# Patient Record
Sex: Female | Born: 1955 | Hispanic: Yes | Marital: Single | Smoking: Never smoker
Health system: Southern US, Community
[De-identification: ages and names within clinical notes are randomized; demographics above are authoritative.]

## PROBLEM LIST (undated history)

## (undated) HISTORY — PX: LEG SURGERY: SHX1003

---

## 2016-09-04 ENCOUNTER — Emergency Department
Admission: EM | Admit: 2016-09-04 | Discharge: 2016-09-04 | Disposition: A | Discharge status: Home / Self Care | Payer: No Typology Code available for payment source | Attending: Emergency Medicine | Admitting: Emergency Medicine

## 2016-09-04 ENCOUNTER — Emergency Department: Payer: No Typology Code available for payment source

## 2016-09-04 ENCOUNTER — Encounter: Payer: Self-pay | Admitting: Emergency Medicine

## 2016-09-04 DIAGNOSIS — M542 Cervicalgia: Secondary | ICD-10-CM | POA: Insufficient documentation

## 2016-09-04 DIAGNOSIS — Y92410 Unspecified street and highway as the place of occurrence of the external cause: Secondary | ICD-10-CM | POA: Insufficient documentation

## 2016-09-04 DIAGNOSIS — M25572 Pain in left ankle and joints of left foot: Secondary | ICD-10-CM | POA: Diagnosis not present

## 2016-09-04 DIAGNOSIS — Y999 Unspecified external cause status: Secondary | ICD-10-CM | POA: Diagnosis not present

## 2016-09-04 DIAGNOSIS — Y939 Activity, unspecified: Secondary | ICD-10-CM | POA: Diagnosis not present

## 2016-09-04 DIAGNOSIS — R072 Precordial pain: Secondary | ICD-10-CM | POA: Insufficient documentation

## 2016-09-04 DIAGNOSIS — R51 Headache: Secondary | ICD-10-CM | POA: Diagnosis not present

## 2016-09-04 DIAGNOSIS — S299XXA Unspecified injury of thorax, initial encounter: Secondary | ICD-10-CM | POA: Diagnosis present

## 2016-09-04 MED ORDER — ACETAMINOPHEN 325 MG PO TABS
650.0000 mg | ORAL_TABLET | Freq: Once | ORAL | Status: AC
  Administered 2016-09-04: 650 mg via ORAL
  Filled 2016-09-04: qty 2

## 2016-09-04 MED ORDER — NAPROXEN 500 MG PO TABS: 500.0000 mg | ORAL_TABLET | Freq: Two times a day (BID) | ORAL | 0 refills | Status: AC
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
## 2016-10-02 ENCOUNTER — Emergency Department: Payer: No Typology Code available for payment source

## 2016-10-02 ENCOUNTER — Encounter: Payer: Self-pay | Admitting: Emergency Medicine

## 2016-10-02 ENCOUNTER — Emergency Department
Admission: EM | Admit: 2016-10-02 | Discharge: 2016-10-02 | Disposition: A | Discharge status: Home / Self Care | Payer: No Typology Code available for payment source | Attending: Student in an Organized Health Care Education/Training Program | Admitting: Student in an Organized Health Care Education/Training Program

## 2016-10-02 DIAGNOSIS — Y92410 Unspecified street and highway as the place of occurrence of the external cause: Secondary | ICD-10-CM | POA: Diagnosis not present

## 2016-10-02 DIAGNOSIS — M791 Myalgia: Secondary | ICD-10-CM | POA: Diagnosis not present

## 2016-10-02 DIAGNOSIS — Y939 Activity, unspecified: Secondary | ICD-10-CM | POA: Insufficient documentation

## 2016-10-02 DIAGNOSIS — M25552 Pain in left hip: Secondary | ICD-10-CM | POA: Diagnosis not present

## 2016-10-02 DIAGNOSIS — M25562 Pain in left knee: Secondary | ICD-10-CM | POA: Insufficient documentation

## 2016-10-02 DIAGNOSIS — Y999 Unspecified external cause status: Secondary | ICD-10-CM | POA: Insufficient documentation

## 2016-10-02 DIAGNOSIS — M7918 Myalgia, other site: Secondary | ICD-10-CM

## 2016-10-02 DIAGNOSIS — S79912A Unspecified injury of left hip, initial encounter: Secondary | ICD-10-CM | POA: Diagnosis present

## 2016-10-02 LAB — URINALYSIS, COMPLETE (UACMP) WITH MICROSCOPIC
Bacteria, UA: NONE SEEN
Bilirubin Urine: NEGATIVE
GLUCOSE, UA: NEGATIVE mg/dL
HGB URINE DIPSTICK: NEGATIVE
KETONES UR: NEGATIVE mg/dL
Leukocytes, UA: NEGATIVE
NITRITE: NEGATIVE
PROTEIN: NEGATIVE mg/dL
Specific Gravity, Urine: 1.008 (ref 1.005–1.030)
pH: 6 (ref 5.0–8.0)

## 2016-10-02 MED ORDER — CYCLOBENZAPRINE HCL 5 MG PO TABS: 5.0000 mg | ORAL_TABLET | Freq: Three times a day (TID) | ORAL | 0 refills | Status: AC | PRN
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
## 2019-09-03 ENCOUNTER — Ambulatory Visit: Payer: Self-pay | Attending: Oncology | Admitting: *Deleted

## 2019-09-03 ENCOUNTER — Other Ambulatory Visit: Payer: Self-pay

## 2019-09-03 ENCOUNTER — Encounter: Payer: Self-pay | Admitting: *Deleted

## 2019-09-03 ENCOUNTER — Ambulatory Visit
Admission: RE | Admit: 2019-09-03 | Discharge: 2019-09-03 | Disposition: A | Discharge status: Home / Self Care | Payer: Self-pay | Source: Ambulatory Visit | Attending: Oncology | Admitting: Oncology

## 2019-09-03 VITALS — BP 128/58 | HR 60 | Temp 97.0°F | Ht 60.5 in | Wt 157.0 lb

## 2019-09-03 DIAGNOSIS — Z0000 Encounter for general adult medical examination without abnormal findings: Secondary | ICD-10-CM

## 2019-09-03 NOTE — Patient Instructions (Signed)
Gave patient hand-out, Women Staying Healthy, Active and Well from [facility], with education on breast health, pap smears, heart and colon health. 

## 2019-09-03 NOTE — Progress Notes (Signed)
  Subjective:     Patient ID: Crystal Huffman, female   DOB: 1956/02/07, 64 y.o.   MRN: [id number]  HPI   Review of Systems     Objective:   Physical Exam Chest:     Breasts:        Right: No swelling, bleeding, inverted nipple, mass, nipple discharge, skin change or tenderness.        Left: No swelling, bleeding, inverted nipple, mass, nipple discharge, skin change or tenderness.    Abdominal:     Palpations: There is no hepatomegaly or splenomegaly.  Genitourinary:    Exam position: Lithotomy position.     Labia:        Right: No rash, tenderness, lesion or injury.        Left: No rash, tenderness, lesion or injury.      Vagina: No signs of injury and foreign body. No vaginal discharge, erythema, tenderness, bleeding, lesions or prolapsed vaginal walls.     Cervix: No cervical motion tenderness, discharge, friability or lesion.     Uterus: Normal.      Adnexa:        Right: No mass or tenderness.         Left: No mass or tenderness.    Lymphadenopathy:     Upper Body:     Right upper body: No supraclavicular or axillary adenopathy.     Left upper body: No supraclavicular or axillary adenopathy.        Assessment:     64 year old Hispanic female referred to [facility] by [facility] for baseline mammogram and pap smear.  Crystal Huffman, the interpreter present during the interview and exam.  Clinical breast exam with bilateral thickening at the upper outer quadrants.  Taught self breast awareness.  Specimen collected for pap smear without difficulty.   Patient has been screened for eligibility.  She does not have any insurance, Medicare or Medicaid.  She also meets financial eligibility.   Risk Assessment    Risk Scores      09/03/2019   Last edited by: Jim Like, RN   5-year risk: 1.2 %   Lifetime risk: 6 %            Plan:     Screening mammogram ordered.  Specimen for pap smear sent to the lab.  Will follow up per [facility] protocol.

## 2019-09-06 LAB — IGP, APTIMA HPV: HPV Aptima: NEGATIVE

## 2019-09-08 ENCOUNTER — Encounter: Payer: Self-pay | Admitting: *Deleted

## 2019-09-08 NOTE — Progress Notes (Signed)
Letter mailed to inform patient of her normal mammogram and pap smear.  She is to return in 1 year for annual screening.

## 2019-09-26 ENCOUNTER — Ambulatory Visit: Payer: Self-pay | Attending: Internal Medicine

## 2019-09-26 DIAGNOSIS — Z23 Encounter for immunization: Secondary | ICD-10-CM

## 2019-09-26 NOTE — Progress Notes (Signed)
   Covid-19 Vaccination Clinic  Name:  Crystal Huffman    MRN: [id number] DOB: 11/15/1955  09/26/2019  [name] was observed post Covid-19 immunization for 15 minutes without incident. She was provided with Vaccine Information Sheet and instruction to access the V-Safe system.   [name] was instructed to call 911 with any severe reactions post vaccine: Marland Kitchen Difficulty breathing  . Swelling of face and throat  . A fast heartbeat  . A bad rash all over body  . Dizziness and weakness   Immunizations Administered    Name Date Dose VIS Date Route   Pfizer COVID-19 Vaccine 09/26/2019  1:29 PM 0.3 mL 06/13/2019 Intramuscular   Manufacturer: [employer], [employer]   Lot: [id number]   NDC: [address]-8

## 2019-10-17 ENCOUNTER — Ambulatory Visit: Payer: Self-pay | Attending: Internal Medicine

## 2019-10-17 DIAGNOSIS — Z23 Encounter for immunization: Secondary | ICD-10-CM

## 2019-10-17 NOTE — Progress Notes (Signed)
   Covid-19 Vaccination Clinic  Name:  ANIVEA VELASQUES    MRN: [id number] DOB: [birth year]  10/17/2019  Ms. Brunetti was observed post Covid-19 immunization for 15 minutes without incident. She was provided with Vaccine Information Sheet and instruction to access the V-Safe system.   Ms. Flaten was instructed to call 911 with any severe reactions post vaccine: Marland Kitchen Difficulty breathing  . Swelling of face and throat  . A fast heartbeat  . A bad rash all over body  . Dizziness and weakness   Immunizations Administered    Name Date Dose VIS Date Route   Pfizer COVID-19 Vaccine 10/17/2019  1:27 PM 0.3 mL 06/13/2019 Intramuscular   Manufacturer: [employer], [employer]   Lot: [id number]   NDC: [address]-8

## 2021-06-04 ENCOUNTER — Other Ambulatory Visit: Payer: Self-pay

## 2021-06-04 ENCOUNTER — Encounter (HOSPITAL_BASED_OUTPATIENT_CLINIC_OR_DEPARTMENT_OTHER): Payer: Self-pay

## 2021-06-04 ENCOUNTER — Emergency Department (HOSPITAL_BASED_OUTPATIENT_CLINIC_OR_DEPARTMENT_OTHER): Payer: Self-pay

## 2021-06-04 ENCOUNTER — Emergency Department (HOSPITAL_BASED_OUTPATIENT_CLINIC_OR_DEPARTMENT_OTHER): Payer: Self-pay | Admitting: Radiology

## 2021-06-04 ENCOUNTER — Emergency Department (HOSPITAL_BASED_OUTPATIENT_CLINIC_OR_DEPARTMENT_OTHER)
Admission: EM | Admit: 2021-06-04 | Discharge: 2021-06-04 | Disposition: A | Discharge status: Home / Self Care | Payer: Self-pay | Attending: Emergency Medicine | Admitting: Emergency Medicine

## 2021-06-04 DIAGNOSIS — S2232XA Fracture of one rib, left side, initial encounter for closed fracture: Secondary | ICD-10-CM | POA: Insufficient documentation

## 2021-06-04 DIAGNOSIS — M79605 Pain in left leg: Secondary | ICD-10-CM | POA: Insufficient documentation

## 2021-06-04 DIAGNOSIS — W108XXA Fall (on) (from) other stairs and steps, initial encounter: Secondary | ICD-10-CM | POA: Insufficient documentation

## 2021-06-04 DIAGNOSIS — S52502A Unspecified fracture of the lower end of left radius, initial encounter for closed fracture: Secondary | ICD-10-CM | POA: Insufficient documentation

## 2021-06-04 DIAGNOSIS — G8911 Acute pain due to trauma: Secondary | ICD-10-CM | POA: Insufficient documentation

## 2021-06-04 MED ORDER — IBUPROFEN 800 MG PO TABS
800.0000 mg | ORAL_TABLET | Freq: Once | ORAL | Status: AC
  Administered 2021-06-04: 800 mg via ORAL
  Filled 2021-06-04: qty 1

## 2021-06-04 MED ORDER — HYDROCODONE-ACETAMINOPHEN 5-325 MG PO TABS: 1.0000 | ORAL_TABLET | Freq: Four times a day (QID) | ORAL | 0 refills | Status: AC | PRN

## 2021-06-04 NOTE — ED Triage Notes (Signed)
Patient here POV from Home with Fall.  Patient fell going downstairs approximately 5-7 steps. Patient braced her Fall with her Left Side. No Head Injury. Patient complaining of Pain to left Torso and Left Arm/Wrist.  NAD Noted during Triage A&Ox4. GCS 15. Ambulatory.

## 2021-06-04 NOTE — Discharge Instructions (Addendum)
You have a wrist fracture.  Please keep splint in place.  You can either follow-up in [place] or you can follow-up at [facility] where you live with an orthopedic doctor.  You also have a rib fracture.  Please use incentive spirometer every 2-3 hours during the day to prevent pneumonia.  Take Tylenol or Motrin for pain.  Take Vicodin for severe pain.  You are expected to be stiff and sore for several days  You need to follow-up with your doctor this week  Return to ER if you have worse rib pain, chest pain, wrist pain

## 2021-06-04 NOTE — ED Provider Notes (Signed)
[facility] EMERGENCY DEPT Provider Note   CSN: [id number] Arrival date & time: 06/04/21  1339     History Chief Complaint  Patient presents with   Crystal Huffman is a 65 y.o. female here presenting with fall.  Patient states that she was trying to get some boxes from the attic and accidentally missed a step and fell down about 5-7 steps.  She states that she landed on the left side.  She denies any loss of consciousness or head injury. Patient is complaining of left wrist pain and left scapular pain and left leg pain.  Patient was able to ambulate afterwards.  Patient took Tylenol prior to arrival  The history is provided by the patient.      History reviewed. No pertinent past medical history.  There are no problems to display for this patient.   Past Surgical History:  Procedure Laterality Date   LEG SURGERY Left      OB History   No obstetric history on file.     Family History  Problem Relation Age of Onset   Breast cancer Neg Hx     Social History   Tobacco Use   Smoking status: Never   Smokeless tobacco: Never  Substance Use Topics   Alcohol use: No   Drug use: No    Home Medications Prior to Admission medications   Not on File    Allergies    Patient has no known allergies.  Review of Systems   Review of Systems  Musculoskeletal:        L wrist and scapula and leg pain   All other systems reviewed and are negative.  Physical Exam Updated Vital Signs BP (!) 147/62   Pulse 62   Temp 98.3 F (36.8 C) (Oral)   Resp 16   Ht 5' (1.524 m)   Wt 71.2 kg   SpO2 97%   BMI 30.66 kg/m   Physical Exam Vitals and nursing note reviewed.  Constitutional:      Comments: Slightly uncomfortable  HENT:     Head: Normocephalic and atraumatic.     Comments: No obvious scalp hematoma    Nose: Nose normal.     Mouth/Throat:     Mouth: Mucous membranes are moist.  Eyes:     Extraocular Movements: Extraocular movements  intact.     Pupils: Pupils are equal, round, and reactive to light.  Cardiovascular:     Rate and Rhythm: Normal rate and regular rhythm.     Pulses: Normal pulses.     Heart sounds: Normal heart sounds.  Pulmonary:     Effort: Pulmonary effort is normal.     Breath sounds: Normal breath sounds.  Abdominal:     General: Abdomen is flat.     Palpations: Abdomen is soft.  Musculoskeletal:     Cervical back: Normal range of motion and neck supple.     Comments: Mild tenderness over the left scapula and lower rib area with minimal bruising.  Lungs are clear.  Patient has swelling in the left wrist area and decreased range of motion due to pain.  Patient has 2+ radial pulse.  Patient does have abrasions on the left shin area.  Patient has normal range of motion bilateral hips and no midline spinal tenderness  Skin:    Capillary Refill: Capillary refill takes less than 2 seconds.  Neurological:     General: No focal deficit present.     Mental  Status: She is oriented to person, place, and time.  Psychiatric:        Mood and Affect: Mood normal.        Behavior: Behavior normal.    ED Results / Procedures / Treatments   Labs (all labs ordered are listed, but only abnormal results are displayed) Labs Reviewed - No data to display  EKG None  Radiology DG Ribs Unilateral W/Chest Left  Result Date: 06/04/2021 CLINICAL DATA:  Trauma, fall EXAM: LEFT RIBS AND CHEST - 3+ VIEW COMPARISON:  09/04/2016 FINDINGS: Cardiac size is within normal limits. There are no focal infiltrates in the lung fields. There is no pleural effusion or pneumothorax. There is questionable break in the cortical margin in the anterolateral aspect of left seventh rib seen in the single image. This finding could not be localized in the rest of the images. IMPRESSION: There is possible break in the cortical margins in the anterolateral aspect of left seventh rib suggesting possible undisplaced fracture or an artifact. There  is no focal pulmonary contusion. There is no pleural effusion or pneumothorax. Electronically Signed   By: Ernie Avena M.D.   On: 06/04/2021 16:14   DG Wrist Complete Left  Result Date: 06/04/2021 CLINICAL DATA:  Fall EXAM: LEFT HAND - COMPLETE 3+ VIEW; LEFT WRIST - COMPLETE 3+ VIEW COMPARISON:  None. FINDINGS: Left wrist: Acute dorsally displaced, dorsally angulated, impacted, intra-articular distal radial fracture. Associated subcutaneus soft tissue edema. No other acute displaced fracture identified. No dislocation. Left hand: There is no evidence of fracture or dislocation. There is no evidence of arthropathy or other focal bone abnormality. Soft tissues are unremarkable. IMPRESSION: 1. Acute dorsally displaced, dorsally angulated, impacted, intra-articular distal radial fracture. 2.  No acute displaced fracture or dislocation of the left hand. Electronically Signed   By: Tish Frederickson M.D.   On: 06/04/2021 16:06   DG Tibia/Fibula Left  Result Date: 06/04/2021 CLINICAL DATA:  Trauma, fall EXAM: LEFT TIBIA AND FIBULA - 2 VIEW COMPARISON:  None. FINDINGS: No recent fracture or dislocation is seen in the left tibia and fibula. Degenerative changes are noted in the the left knee, more so in the lateral compartment. There is previous internal fixation in the distal left tibia and fibula. There are tortuous ectatic vessels in the subcutaneous plane along the medial aspect of left knee and left calf. IMPRESSION: 1. No recent fracture or dislocation is seen in the left tibia and fibula. 2.  Degenerative changes are noted in the left knee. 3. Varicose veins are seen in the subcutaneous plane along the medial aspect of left knee and left calf. Electronically Signed   By: Ernie Avena M.D.   On: 06/04/2021 16:08   DG Hand Complete Left  Result Date: 06/04/2021 CLINICAL DATA:  Fall EXAM: LEFT HAND - COMPLETE 3+ VIEW; LEFT WRIST - COMPLETE 3+ VIEW COMPARISON:  None. FINDINGS: Left wrist: Acute  dorsally displaced, dorsally angulated, impacted, intra-articular distal radial fracture. Associated subcutaneus soft tissue edema. No other acute displaced fracture identified. No dislocation. Left hand: There is no evidence of fracture or dislocation. There is no evidence of arthropathy or other focal bone abnormality. Soft tissues are unremarkable. IMPRESSION: 1. Acute dorsally displaced, dorsally angulated, impacted, intra-articular distal radial fracture. 2.  No acute displaced fracture or dislocation of the left hand. Electronically Signed   By: Tish Frederickson M.D.   On: 06/04/2021 16:06    Procedures Procedures   Medications Ordered in ED Medications  ibuprofen (ADVIL) tablet 800  mg (800 mg Oral Given 06/04/21 1616)    ED Course  I have reviewed the triage vital signs and the nursing notes.  Pertinent labs & imaging results that were available during my care of the patient were reviewed by me and considered in my medical decision making (see chart for details).    MDM Rules/Calculators/A&P                           Crystal Huffman is a 65 y.o. female who presented with mechanical fall.  She fell down 5-7 steps.  There is no head injury.  There is some swelling of the left wrist area and also tenderness in the left scapula.  We will get wrist x-rays and left rib x-rays   4:47 PM X-ray showed distal radial fracture.  Patient is able to range her wrist.  We will place and a sugar-tong splint.  Patient also has 1 rib fracture on the left side with no hemothorax or pneumothorax.  Patient's pain is under control with Motrin alone.  Patient lives at [place].  I told her that she can follow-up with a hand surgeon either here or at [facility]  We will prescribe Vicodin as needed for pain.  Told her to use incentive spirometer to help her prevent pneumonia  Final Clinical Impression(s) / ED Diagnoses Final diagnoses:  None    Rx / DC Orders ED Discharge Orders     None         Charlynne Pander, MD 06/04/21 [phone]
